# Patient Record
Sex: Male | Born: 1995 | Race: White | Hispanic: No | Marital: Married | Smoking: Never smoker
Health system: Southern US, Community
[De-identification: ages and names within clinical notes are randomized; demographics above are authoritative.]

---

## 2020-02-15 ENCOUNTER — Emergency Department: Payer: Medicaid Other

## 2020-02-15 ENCOUNTER — Other Ambulatory Visit: Payer: Self-pay

## 2020-02-15 ENCOUNTER — Emergency Department
Admission: EM | Admit: 2020-02-15 | Discharge: 2020-02-15 | Disposition: A | Discharge status: Home / Self Care | Payer: Medicaid Other | Attending: Emergency Medicine | Admitting: Emergency Medicine

## 2020-02-15 DIAGNOSIS — R05 Cough: Secondary | ICD-10-CM | POA: Diagnosis present

## 2020-02-15 DIAGNOSIS — Z20822 Contact with and (suspected) exposure to covid-19: Secondary | ICD-10-CM | POA: Insufficient documentation

## 2020-02-15 DIAGNOSIS — J069 Acute upper respiratory infection, unspecified: Secondary | ICD-10-CM | POA: Diagnosis not present

## 2020-02-15 LAB — SARS CORONAVIRUS 2 BY RT PCR (HOSPITAL ORDER, PERFORMED IN ~~LOC~~ HOSPITAL LAB): SARS Coronavirus 2: NEGATIVE

## 2020-02-15 MED ORDER — BENZONATATE 100 MG PO CAPS: 200.0000 mg | ORAL_CAPSULE | Freq: Three times a day (TID) | ORAL | 0 refills | Status: AC | PRN

## 2020-02-15 NOTE — ED Provider Notes (Signed)
[facility] Emergency Department Provider Note   ____________________________________________   First MD Initiated Contact with Patient 02/15/20 1105     (approximate)  I have reviewed the triage vital signs and the nursing notes.   HISTORY  Chief Complaint Cough    HPI Clarence Duran is a 24 y.o. male patient complain productive cough for 4 days.  Patient that his job was sent into the ED because he is not working so he has been cleared of COVID-19.  Patient denies loss of taste or loss of smell.  Patient denies fever/ chills associated with complaint.  Patient denies recent travel or known contact with COVID-19.         History reviewed. No pertinent past medical history.  There are no problems to display for this patient.   History reviewed. No pertinent surgical history.  Prior to Admission medications   Medication Sig Start Date End Date Taking? Authorizing Provider  benzonatate (TESSALON PERLES) 100 MG capsule Take 2 capsules (200 mg total) by mouth 3 (three) times daily as needed. 02/15/20 02/14/21  Sable Feil, PA-C    Allergies Tomato  No family history on file.  Social History Social History   Tobacco Use  . Smoking status: Never Smoker  Substance Use Topics  . Alcohol use: Not Currently  . Drug use: Not on file    Review of Systems Constitutional: No fever/chills Eyes: No visual changes. ENT: No sore throat. Cardiovascular: Denies chest pain. Respiratory: Denies shortness of breath.  Productive cough. Gastrointestinal: No abdominal pain.  No nausea, no vomiting.  No diarrhea.  No constipation. Genitourinary: Negative for dysuria. Musculoskeletal: Negative for back pain. Skin: Negative for rash. Neurological: Negative for headaches, focal weakness or numbness. Allergic/Immunilogical: Tomatoes  ____________________________________________   PHYSICAL EXAM:  VITAL SIGNS: ED Triage Vitals [02/15/20 1102]  Enc Vitals  Group     BP 124/72     Pulse      Resp 16     Temp 98.4 F (36.9 C)     Temp Source Oral     SpO2 98 %     Weight 163 lb (73.9 kg)     Height 5\' 9"  (1.753 m)     Head Circumference      Peak Flow      Pain Score 0     Pain Loc      Pain Edu?      Excl. in [place]?    Constitutional: Alert and oriented. Well appearing and in no acute distress. Neck: No stridor.  Hematological/Lymphatic/Immunilogical: No cervical lymphadenopathy. Cardiovascular: Normal rate, regular rhythm. Grossly normal heart sounds.  Good peripheral circulation. Respiratory: Normal respiratory effort.  No retractions. Lungs CTAB. Gastrointestinal: Soft and nontender. No distention. No abdominal bruits. No CVA tenderness. Skin:  Skin is warm, dry and intact. No rash noted. Psychiatric: Mood and affect are normal. Speech and behavior are normal.  ____________________________________________   LABS (all labs ordered are listed, but only abnormal results are displayed)  Labs Reviewed  SARS CORONAVIRUS 2 BY RT PCR (HOSPITAL ORDER, [facility])   ____________________________________________  EKG   ____________________________________________  RADIOLOGY  ED MD interpretation:    Official radiology report(s): DG Chest Portable 1 View  Result Date: 02/15/2020 CLINICAL DATA:  Productive cough for 4 days. Additional history provided: Patient reports cough for 4 days, denies fever. EXAM: PORTABLE CHEST 1 VIEW COMPARISON:  No pertinent prior studies available for comparison. FINDINGS: Heart size within normal  limits. There is no appreciable airspace consolidation. No evidence of pleural effusion or pneumothorax. No acute bony abnormality identified. IMPRESSION: No evidence of acute cardiopulmonary abnormality. Electronically Signed   By: Jackey Loge DO   On: 02/15/2020 11:44    ____________________________________________   PROCEDURES  Procedure(s) performed (including Critical  Care):  Procedures   ____________________________________________   INITIAL IMPRESSION / ASSESSMENT AND PLAN / ED COURSE  As part of my medical decision making, I reviewed the following data within the electronic MEDICAL RECORD NUMBER     Patient presents with 4 days productive cough.  Discussed negative chest x-ray findings with patient.  Patient given discharge care instruction advised take medication as directed.  Patient advised self quarantine pending results of COVID-19 test.  Tested positive quarantine additional 10 days.   Clarence Duran was evaluated in Emergency Department on 02/15/2020 for the symptoms described in the history of present illness. He was evaluated in the context of the global COVID-19 pandemic, which necessitated consideration that the patient might be at risk for infection with the SARS-CoV-2 virus that causes COVID-19. Institutional protocols and algorithms that pertain to the evaluation of patients at risk for COVID-19 are in a state of rapid change based on information released by regulatory bodies including the CDC and federal and state organizations. These policies and algorithms were followed during the patient's care in the ED.       ____________________________________________   FINAL CLINICAL IMPRESSION(S) / ED DIAGNOSES  Final diagnoses:  Viral URI with cough     ED Discharge Orders         Ordered    benzonatate (TESSALON PERLES) 100 MG capsule  3 times daily PRN     02/15/20 1222           Note:  This document was prepared using Dragon voice recognition software and may include unintentional dictation errors.    Joni Reining, PA-C 02/15/20 1225    Jene Every, MD 02/15/20 [phone]

## 2020-02-15 NOTE — Discharge Instructions (Addendum)
Follow discharge care instructions.  You may find the results of your COVID-19 chart in the MyChart app.  You will also be notified telephonically if test is positive.  Take medication as directed for cough.

## 2020-02-15 NOTE — ED Notes (Signed)
See triage note. Pt ambulatory to room. Pt in NAD. Pt complaint of cough x4 days

## 2020-02-15 NOTE — ED Triage Notes (Signed)
Reports cough X 4 days. Denies fever, sore throat or nasal congestion Pt alert and oriented X4, cooperative, RR even and unlabored, color WNL. Pt in NAD.

## 2020-09-22 IMAGING — DX DG CHEST 1V PORT
1 series · 1 of 1 positions shown · non-contrast
Comparison: No pertinent prior studies available for comparison.

CLINICAL DATA: Productive cough for 4 days. Additional history
provided: Patient reports cough for 4 days, denies fever.

EXAM:
PORTABLE CHEST 1 VIEW

[chest ap]
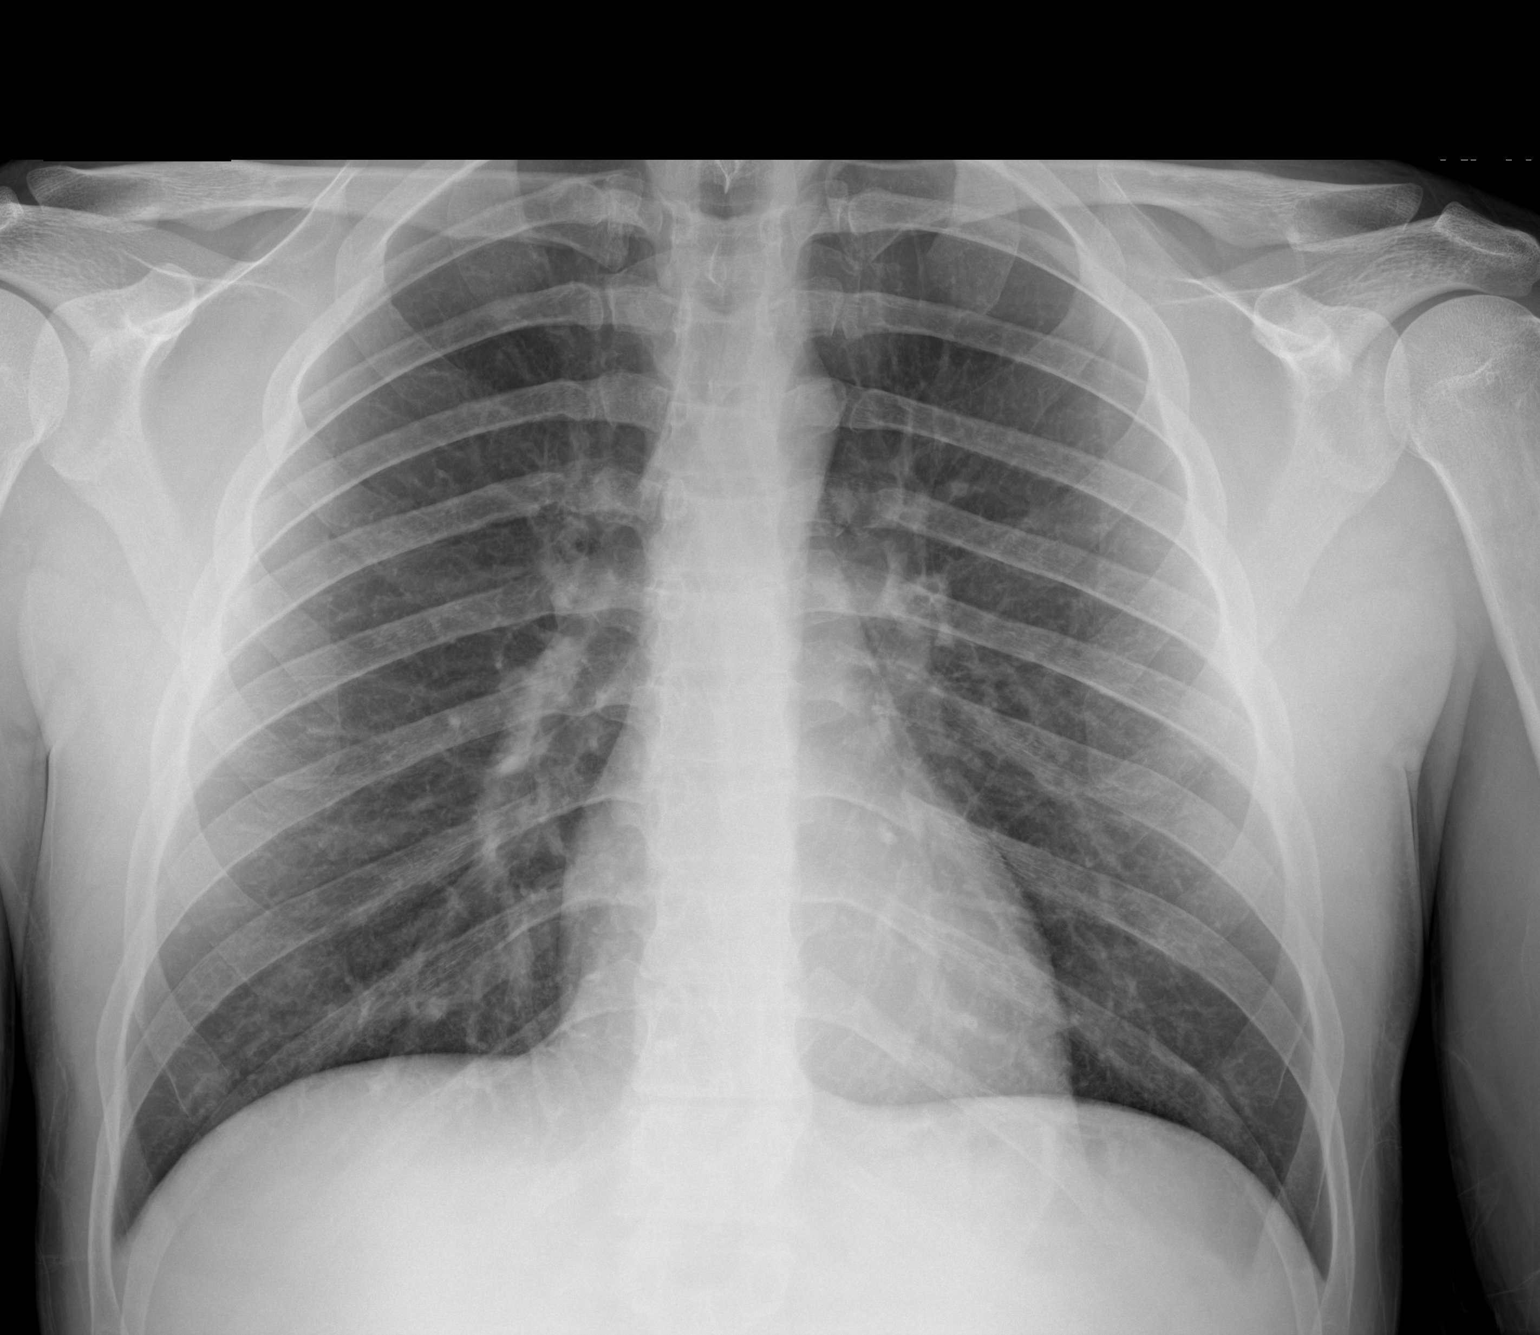

[1 of 1 positions shown; findings below may reference images not displayed]

FINDINGS: Heart size within normal limits.

There is no appreciable airspace consolidation.

No evidence of pleural effusion or pneumothorax.

No acute bony abnormality identified.
IMPRESSION: No evidence of acute cardiopulmonary abnormality.
